# Patient Record
Sex: Female | Born: 1970 | Race: White | Hispanic: No | Smoking: Current every day smoker
Health system: Southern US, Community
[De-identification: ages and names within clinical notes are randomized; demographics above are authoritative.]

## PROBLEM LIST (undated history)

## (undated) DIAGNOSIS — M25512 Pain in left shoulder: Secondary | ICD-10-CM

## (undated) DIAGNOSIS — M549 Dorsalgia, unspecified: Secondary | ICD-10-CM

## (undated) DIAGNOSIS — G8929 Other chronic pain: Secondary | ICD-10-CM

## (undated) DIAGNOSIS — M25571 Pain in right ankle and joints of right foot: Secondary | ICD-10-CM

## (undated) DIAGNOSIS — M5430 Sciatica, unspecified side: Secondary | ICD-10-CM

## (undated) HISTORY — PX: TUBAL LIGATION: SHX77

---

## 2014-09-08 ENCOUNTER — Emergency Department (HOSPITAL_COMMUNITY)
Admission: EM | Admit: 2014-09-08 | Discharge: 2014-09-08 | Disposition: A | Discharge status: Home / Self Care | Payer: Self-pay | Attending: Emergency Medicine | Admitting: Emergency Medicine

## 2014-09-08 ENCOUNTER — Emergency Department (HOSPITAL_COMMUNITY): Payer: Self-pay

## 2014-09-08 ENCOUNTER — Encounter (HOSPITAL_COMMUNITY): Payer: Self-pay

## 2014-09-08 DIAGNOSIS — G8929 Other chronic pain: Secondary | ICD-10-CM | POA: Insufficient documentation

## 2014-09-08 DIAGNOSIS — M25512 Pain in left shoulder: Secondary | ICD-10-CM | POA: Insufficient documentation

## 2014-09-08 DIAGNOSIS — M25571 Pain in right ankle and joints of right foot: Secondary | ICD-10-CM | POA: Insufficient documentation

## 2014-09-08 DIAGNOSIS — Z720 Tobacco use: Secondary | ICD-10-CM | POA: Insufficient documentation

## 2014-09-08 DIAGNOSIS — Z880 Allergy status to penicillin: Secondary | ICD-10-CM | POA: Insufficient documentation

## 2014-09-08 HISTORY — DX: Sciatica, unspecified side: M54.30

## 2014-09-08 HISTORY — DX: Dorsalgia, unspecified: M54.9

## 2014-09-08 HISTORY — DX: Other chronic pain: G89.29

## 2014-09-08 HISTORY — DX: Pain in left shoulder: M25.512

## 2014-09-08 HISTORY — DX: Pain in right ankle and joints of right foot: M25.571

## 2014-09-08 MED ORDER — ACETAMINOPHEN 500 MG PO TABS
1000.0000 mg | ORAL_TABLET | Freq: Once | ORAL | Status: AC
  Administered 2014-09-08: 1000 mg via ORAL
  Filled 2014-09-08: qty 2

## 2014-09-08 MED ORDER — METHOCARBAMOL 500 MG PO TABS: 1000.0000 mg | ORAL_TABLET | Freq: Four times a day (QID) | ORAL | Status: AC | PRN

## 2014-09-08 NOTE — Discharge Instructions (Signed)
°Emergency Department Resource Guide °1) Find a Doctor and Pay Out of Pocket °Although you won't have to find out who is covered by your insurance plan, it is a good idea to ask around and get recommendations. You will then need to call the office and see if the doctor you have chosen will accept you as a new patient and what types of options they offer for patients who are self-pay. Some doctors offer discounts or will set up payment plans for their patients who do not have insurance, but you will need to ask so you aren't surprised when you get to your appointment. ° °2) Contact Your Local Health Department °Not all health departments have doctors that can see patients for sick visits, but many do, so it is worth a call to see if yours does. If you don't know where your local health department is, you can check in your phone book. The CDC also has a tool to help you locate your state's health department, and many state websites also have listings of all of their local health departments. ° °3) Find a Walk-in Clinic °If your illness is not likely to be very severe or complicated, you may want to try a walk in clinic. These are popping up all over the country in pharmacies, drugstores, and shopping centers. They're usually staffed by nurse practitioners or physician assistants that have been trained to treat common illnesses and complaints. They're usually fairly quick and inexpensive. However, if you have serious medical issues or chronic medical problems, these are probably not your best option. ° °No Primary Care Doctor: °- Call [facility] at  832-8000 - they can help you locate a primary care doctor that  accepts your insurance, provides certain services, etc. °- Physician Referral Service- 1-800-533-3463 ° °Chronic Pain Problems: °Organization         Address  Phone   Notes  °[facility]  (336) 297-2271 Patients need to be referred by their primary care doctor.  ° °Medication  Assistance: °Organization         Address  Phone   Notes  °[place] [facility] [address] °[place], [place] [address] (336) 641-8030 --Must be a resident of [place] °-- Must have NO insurance coverage whatsoever (no Medicaid/ Medicare, etc.) °-- The pt. MUST have a primary care doctor that directs their care regularly and follows them in the community °  °[facility]  (866) 331-1348   °[employer]  (888) 892-1162   ° °Agencies that provide inexpensive medical care: °Organization         Address  Phone   Notes  °[facility]  (336) 832-8035   °[facility]    (336) 832-7272   °[facility] [address] °[place], [place] [address] (336) 832-4777   °[facility] [address], °[place] (336) 271-4999   °[facility]    (336) 373-0678   °[facility]    (336) 272-1050   °[facility] ° [address], [place] Phone:  (336) 832-4444, Fax:  (336) 832-4440 Hours of Operation:  9 am - 6 pm, M-F.  Also accepts Medicaid/Medicare and self-pay.  °[facility] ° [address], [place] Phone: (336) 832-3150, Fax: (336) 832-3151. Hours of Operation:  8:30 am - 5:30 pm, M-F.  Also accepts Medicaid and self-pay.  °[facility] [place] [address], [place] Phone: (336) 878-6027   °[facility] [address], [place], [place] (336)723-1848, Ext. 123 Mondays & Thursdays: 7-9 AM.  First 15 patients are seen on a first come, first serve basis. °  ° °Medicaid-accepting [place] Providers: ° °Organization         Address  Phone   Notes  °[facility] [address], [place] (336) 641-2100 Also accepts self-pay patients.  °[facility] [address], [place] ° (336) 856-9996   °[facility] [address], [place]  (336) 288-8857   °[facility] [address] [place] [address], [place] (336) 299-7000   °Veita Bland [address], [place]  ° (336) 373-1557 Only accepts [place] Access Medicaid patients after they have their name applied to their card.  ° °Self-Pay (no insurance) in [place]: ° °Organization         Address  Phone   Notes  °Sickle Cell Patients, [facility] [address], [place] (336) 832-1970   °[facility] [address], [place] (336) 832-4400   °[facility] ° [address], [place] (336) 992-4800   °[facility]/Dr. Osei-Bonsu ° [address] [place] [address], [place] or [address], [place] (336) 841-8500 Phone number for both [place] and [place] locations is the same.  °[facility] [address], [place] (336) 299-0000   °[facility] [address] [place] [address], [place] or [address] (336) 852-7530 °(336) 878-2260   °[facility] [address], [place] (336) 350-1642, phone; (336) 294-5005, fax Sees patients 1st and 3rd Saturday of every month.  Must not qualify for public or private insurance (i.e. Medicaid, Medicare, [place] Health Choice, Veterans' Benefits) • Household income should be no more than 200% of the poverty level •The clinic cannot treat you if you are pregnant or think you are pregnant • Sexually transmitted diseases are not treated at the clinic.  ° ° °Dental Care: °Organization         Address  Phone  Notes  °[place] [facility] [address], [place] (336) 641-6152 Accepts children up to age 21 who are enrolled in Medicaid or [place] Health Choice; pregnant women with a Medicaid card; and children who have applied for Medicaid or [place] Health Choice, but were declined, whose parents can pay a reduced fee at time of service.  °[place]  [facility] [place]  [address], [place] (336) 641-7733 Accepts children up to age 21 who are enrolled in Medicaid or [place] Health Choice; pregnant women with a Medicaid card; and children who have applied for Medicaid or [place] Health Choice, but were declined, whose parents can pay a reduced fee at time of service.  °[facility] ° [address], [place] (336) 641-4533 Patients are seen by appointment only. Walk-ins are not accepted. [facility] will see patients 18 years of age and older. °Monday - Tuesday (8am-5pm) °Most Wednesdays (8:30-5pm) °$30 per visit, cash only  °[facility] ° [address], [place] (336) 641-4533 Patients are seen by appointment only. Walk-ins are not accepted. [facility] will see patients 18 years of age and older. °One   Wednesday Evening (Monthly: Volunteer Based).  $30 per visit, cash only  °[facility]  (919) 537-3737 for adults; Children under age 4, call Graduate Pediatric Dentistry at ([phone]) [phone]. Children aged 4-14, please call ([phone]) [phone] to request a pediatric application. ° Dental services are provided in all areas of dental care including fillings, crowns and bridges, complete and partial dentures, implants, gum treatment, root canals, and extractions. Preventive care is also provided. Treatment is provided to both adults and children. °Patients are selected via a lottery and there is often a waiting list. °  °[facility] [address], °[place] ° (336) 763-8833 [email or web address] °  °[facility] [address], [place], [place] (336)723-1848, Ext. 123 Second and Fourth Thursday of each month, opens at 6:30 AM; Clinic ends at 9 AM.  Patients are seen on a first-come first-served basis, and a limited number are seen during each clinic.  ° °[facility] ° [address], [place], [place] (336) [phone]    Eligibility Requirements °You must have lived in [place], [place], or [place] counties for at least the last three months. °  You cannot be eligible for state or federal sponsored healthcare insurance, including Veterans Administration, Medicaid, or Medicare. °  You generally cannot be eligible for healthcare insurance through your employer.  °  How to apply: °Eligibility screenings are held every Tuesday and Wednesday afternoon from 1:00 pm until 4:00 pm. You do not need an appointment for the interview!  °[facility] [address], [place], [place] 336-631-2330   °[facility]  336-342-8273   °[facility]  336-703-3100   °[facility]  336-570-6415   ° °Behavioral Health Resources in the Community: °Intensive Outpatient Programs °Organization         Address  Phone  Notes  °[facility] [address], [place], [place] 336-878-6098   °[facility] [address], [place], [place] 336-832-9800   °[facility] [address], [place], [place] ° 336-882-2125   °[facility] [address],  °[place], [place] 1-800-853-5163 or 336-641-4981   °Substance Abuse Resources °Organization         Address  Phone  Notes  °[facility]  336-882-2125   °[facility]  336-784-9470   °[facility]  336-285-9073   °[facility]  336-845-3988   °Residential & Outpatient Substance Abuse Program  1-800-659-3381   °Psychological Services °Organization         Address  Phone  Notes  °[facility]  336- 832-9600   °[facility]  336- 378-7881   °[facility] [address], [place] 1-800-853-5163 or 336-641-4981   ° °Mobile Crisis Teams °Organization         Address  Phone  Notes  °[facility], Mobile Crisis Care Unit  1-877-626-1772   °Assertive °[facility] ° [address]  [place], [place] 336-834-9664   °Sharon DeEsch [address] °[place] 336-554-5454   ° °Self-Help/Support Groups °Organization         Address  Phone             Notes  °[facility]  - variety of support groups  336- 373-1402 Call for more information  °Narcotics Anonymous (NA), [facility] [address], °[place]  2 meetings at this location  ° °  Residential Treatment Programs Organization         Address  Phone  Notes  [facility] [address]    [place]  0-981-191-4782   [facility]  [address], [place] [address], [place], [place] 086-578-4696   [facility] [address] [place], [place] 295-284-1324 Admissions: 8am-3pm M-F  [facility] [address]    [place], [place] 401-027-2536   [facility] [address] [place], [place], [place] 644-034-7425   [facility] [address]  [place], [place] 956-387-5643   [facility] Intensive Outpatient [address], [place], [place] 329-518-8416   [facility] [address] [place]  [place], [place] 6-063-016-0109 or (516) 626-7641   [facility] ([facility] [address], [place], [place] 254-270-6237 Accepts Medicaid  [facility] [address],  [place] 6-283-151-7616 Substance Abuse/Addiction Treatment   [facility]         Address  Phone  Notes  [facility]  726-858-0385   Angie Fava, PhD [address] [place] [place], [place]   3395003241 or 787-308-1424   [facility]   [address] [place], [place] (318) 849-5323   [facility] [address] [place], [place] [phone] Insurance/Medicaid/sponsorship through [facility] [address]                                    [place], [place] (574)[phone] Therapy/tele-psych/case    [facility] [address], [place] 320-050-0840    Dr. Lolly Mustache  (614)244-5810   [facility]  [employer] [facility] Dept. 1) [address], [place] 2) [address], [place] 3)  [address], [place] [phone]  984-757-0731   [facility] Child Abuse Hotline (726)418-1074 or 361-411-4930 (After Hours)      Take the prescription as directed. Also take over the counter tylenol, as directed on packaging, as needed for discomfort. Wear the ankle brace for support for the next 1 to 2 weeks. Apply moist heat or ice to the area(s) of discomfort, for 15 minutes at a time, several times per day for the next few days.  Do not fall asleep on a heating or ice pack.  Call your regular medical doctor on Monday to schedule a follow up appointment this week.  Return to the Emergency Department immediately if worsening.

## 2014-09-08 NOTE — ED Provider Notes (Signed)
CSN: [id number]     Arrival date & time 09/08/14  1916 History   First MD Initiated Contact with Patient 09/08/14 1957     Chief Complaint  Patient presents with  . Ankle Pain  . Foot Pain  . Pain    chronic pain      HPI Pt was seen at 2020. Per pt, c/o gradual onset and persistence of constant acute flair of her chronic left shoulder and right ankle "pains" that began last night. Pt denies injury, no abd pain, no CP/SOB, no neck or back pain, no focal motor weakness, no tingling/numbness in extremities, no rash, no fevers.    Past Medical History  Diagnosis Date  . Chronic pain of right ankle   . Chronic left shoulder pain   . Chronic back pain   . Sciatica    Past Surgical History  Procedure Laterality Date  . Tubal ligation      Social History  Substance Use Topics  . Smoking status: Current Every Day Smoker -- 1.00 packs/day    Types: Cigarettes  . Smokeless tobacco: None  . Alcohol Use: No    Review of Systems ROS: Statement: All systems negative except as marked or noted in the HPI; Constitutional: Negative for fever and chills. ; ; Eyes: Negative for eye pain, redness and discharge. ; ; ENMT: Negative for ear pain, hoarseness, nasal congestion, sinus pressure and sore throat. ; ; Cardiovascular: Negative for chest pain, palpitations, diaphoresis, dyspnea and peripheral edema. ; ; Respiratory: Negative for cough, wheezing and stridor. ; ; Gastrointestinal: Negative for nausea, vomiting, diarrhea, abdominal pain, blood in stool, hematemesis, jaundice and rectal bleeding. . ; ; Genitourinary: Negative for dysuria, flank pain and hematuria. ; ; Musculoskeletal: +left shoulder pain, right ankle pain. Negative for back pain and neck pain. Negative for swelling and trauma.; ; Skin: Negative for pruritus, rash, abrasions, blisters, bruising and skin lesion.; ; Neuro: Negative for headache, lightheadedness and neck stiffness. Negative for weakness, altered level of consciousness ,  altered mental status, extremity weakness, paresthesias, involuntary movement, seizure and syncope.      Allergies  Aspirin; Benadryl; Motrin; and Penicillins  Home Medications   Prior to Admission medications   Not on File   BP 132/94 mmHg  Pulse 62  Temp(Src) 97.5 F (36.4 C) (Oral)  Resp 18  Ht  (1.6 m)  Wt 162 lb (73.483 kg)  BMI 28.70 kg/m2  SpO2 100% Physical Exam  2025: Physical examination:  Nursing notes reviewed; Vital signs and O2 SAT reviewed;  Constitutional: Well developed, Well nourished, Well hydrated, In no acute distress; Head:  Normocephalic, atraumatic; Eyes: EOMI, PERRL, No scleral icterus; ENMT: Mouth and pharynx normal, Mucous membranes moist; Neck: Supple, Full range of motion, No lymphadenopathy; Cardiovascular: Regular rate and rhythm, No murmur, rub, or gallop; Respiratory: Breath sounds clear & equal bilaterally, No rales, rhonchi, wheezes.  Speaking full sentences with ease, Normal respiratory effort/excursion; Chest: Nontender, Movement normal; Abdomen: Soft, Nontender, Nondistended, Normal bowel sounds; Genitourinary: No CVA tenderness; Spine:  No midline CS, TS, LS tenderness.;; Extremities: Pulses normal. +tender to palp right lateral maleolar area without edema, erythema, ecchymosis, abrasions, deformity, or open wounds. NMS intact right foot, strong pedal pp, LE muscle compartments soft.  No right proximal fibular head tenderness, no knee tenderness, no foot tenderness.  +plantarflexion of right foot w/calf squeeze.  No palpable gap right Achilles's tendon. No calf tenderness, edema or asymmetry. Left shoulder w/FROM. +generalized TTP palp entire shoulder. Left clavicle  NT, scapula NT, proximal humerus NT, biceps tendon NT over bicipital groove.  Motor strength at shoulder normal.  Sensation intact over deltoid region, distal NMS intact with left hand having intact and equal sensation and strength in the distribution of the median, radial, and ulnar  nerve function compared to opposite side.  Strong radial pulse. No deformity, no rash, no edema, no ecchymosis. +FROM left elbow with intact motor strength biceps and triceps muscles to resistance.; Neuro: AA&Ox3, Major CN grossly intact.  Speech clear. No gross focal motor or sensory deficits in extremities. Climbs on and off stretcher easily by herself. Gait steady.; Skin: Color normal, Warm, Dry.   ED Course  Procedures (including critical care time)  Labs Review  Imaging Review  I have personally reviewed and evaluated these images and lab results as part of my medical decision-making.   EKG Interpretation None      MDM  MDM Reviewed: previous chart, nursing note and vitals Reviewed previous: x-ray Interpretation: x-ray    Dg Ankle Complete Right 09/08/2014   CLINICAL DATA:  Right ankle pain, nontraumatic.  EXAM: RIGHT ANKLE - COMPLETE 3+ VIEW  COMPARISON:  None.  FINDINGS: There is no evidence of fracture, dislocation, or joint effusion. There is no evidence of arthropathy or other focal bone abnormality. Soft tissues are unremarkable.  IMPRESSION: Negative.   Electronically Signed   By: Ellery Plunk M.D.   On: 09/08/2014 21:40   Dg Shoulder Left 09/08/2014   CLINICAL DATA:  Chronic left shoulder pain.  Initial encounter.  EXAM: LEFT SHOULDER - 2+ VIEW  COMPARISON:  None.  FINDINGS: There is no evidence of fracture or dislocation. The left humeral head is seated within the glenoid fossa. The acromioclavicular joint is unremarkable in appearance. No significant soft tissue abnormalities are seen. The visualized portions of the left lung are clear.  IMPRESSION: No evidence of fracture or dislocation.   Electronically Signed   By: Roanna Raider M.D.   On: 09/08/2014 21:31   Dg Foot Complete Right 09/08/2014   CLINICAL DATA:  Right foot and ankle pain, nontraumatic.  EXAM: RIGHT FOOT COMPLETE - 3+ VIEW  COMPARISON:  None.  FINDINGS: Negative for fracture, dislocation or radiopaque  foreign body. No arthritic changes are evident. There is no bone lesion or bony destruction. No significant soft tissue abnormality is evident.  IMPRESSION: Negative.   Electronically Signed   By: Ellery Plunk M.D.   On: 09/08/2014 21:24      2030:  Pt came to the ED with 2 others "just in from [place]," also with multiple somatic complaints. EPIC chart reviewed. These 3 pt's have been evaluated at several hospitals in [place] for these same complaints. Pt queried regarding having been evaluated for her complaints previously. Pt told me no several times. I asked her again, and she still replied no. I then asked her about her ED visit on 09/01/14 at [facility] for this complaint (s/p "twisted right ankle while walking in the grass on 08/31/14"). Pt stated "well that was there in [place]." Pt informed she I would treat her pain, but not with narcotic medicines. Pt verb understanding.   2215:  XR reassuring. Tx symptomatically. No narcotics due to concern regarding drug seeking behavior. Dx and testing d/w pt.  Questions answered.  Verb understanding, agreeable to d/c home with outpt f/u.          Samuel Jester, DO 09/11/14 1740

## 2014-09-08 NOTE — ED Notes (Signed)
Patient c/o pain to right hip, right foot, bilateral ankles, and left shoulder. Patient is ambulatory into triage.

## 2014-09-08 NOTE — ED Notes (Signed)
Patient ambulated to restroom, tolerated well. 

## 2016-10-14 IMAGING — DX DG FOOT COMPLETE 3+V*R*
3 series · 3 of 3 positions shown · non-contrast
Comparison: None.

CLINICAL DATA: Right foot and ankle pain, nontraumatic.

EXAM:
RIGHT FOOT COMPLETE - 3+ VIEW

[foot ap]
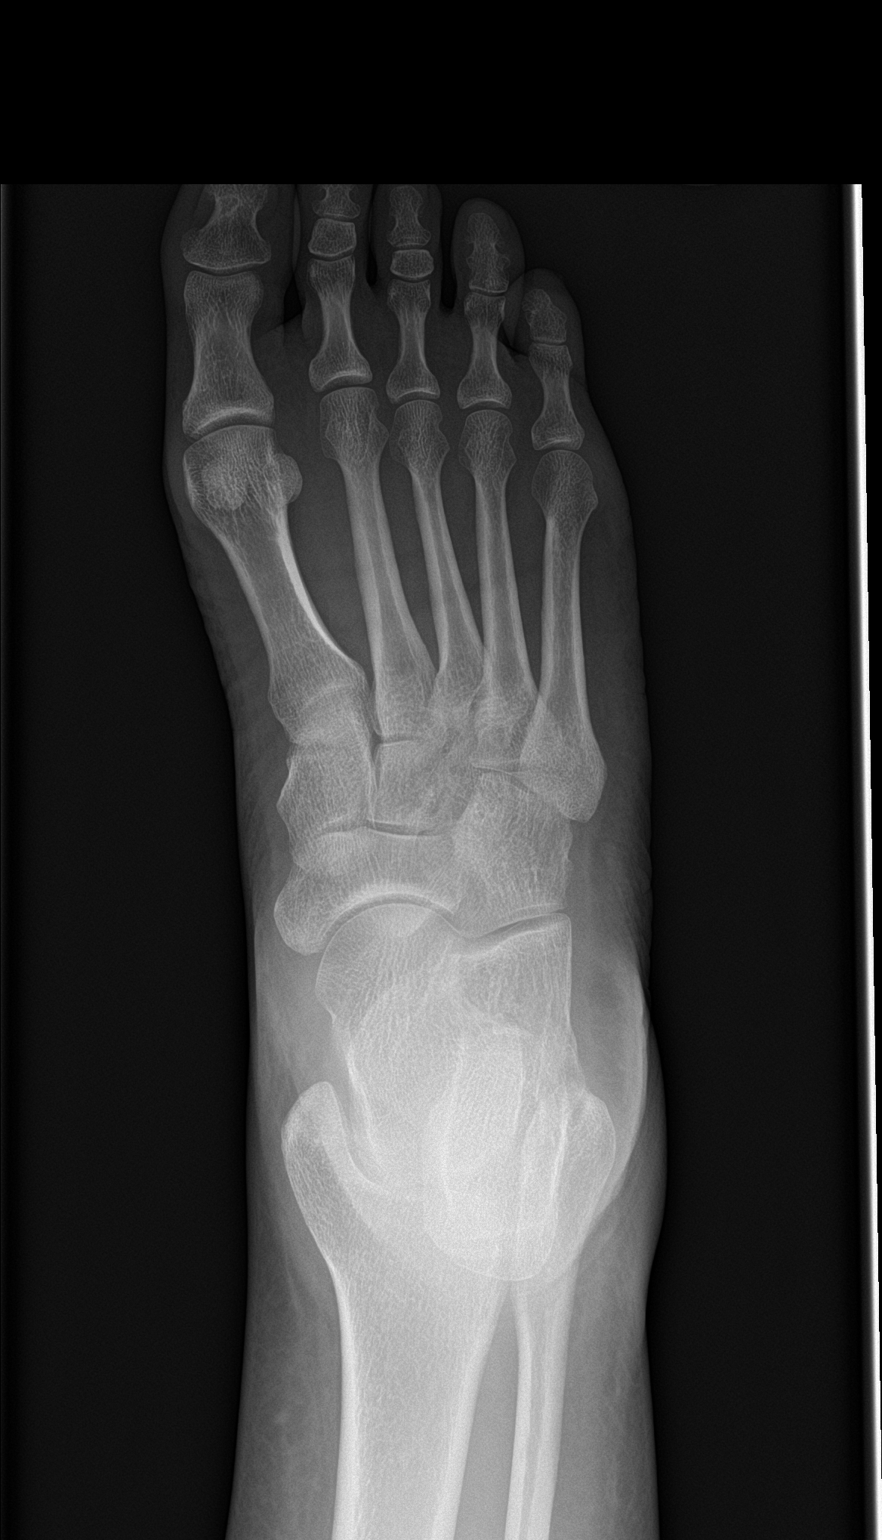

[foot obl]
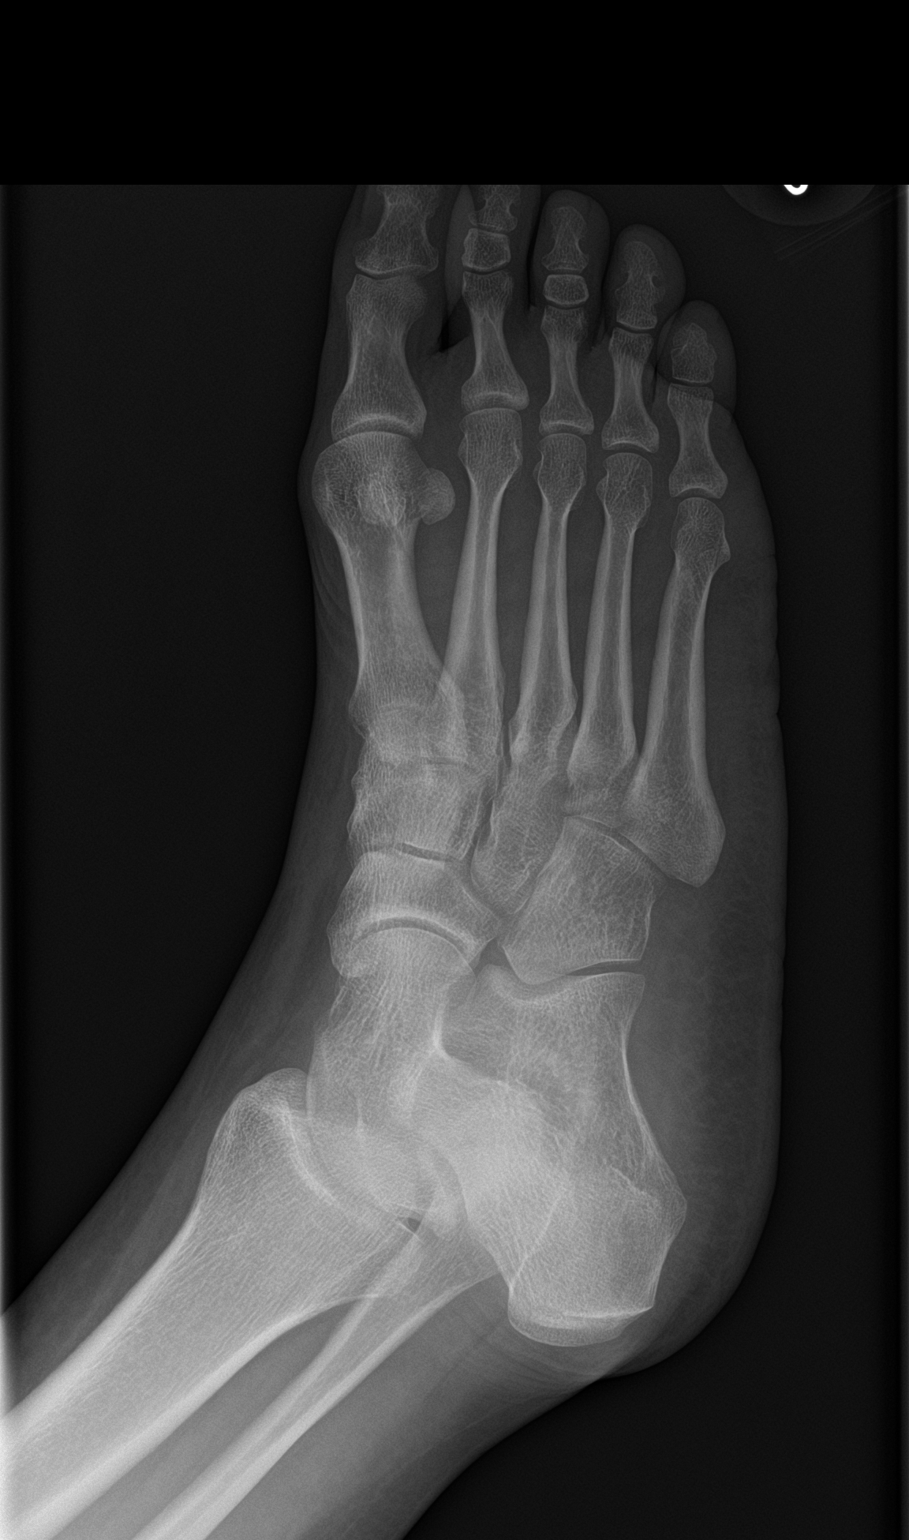

[foot lat]
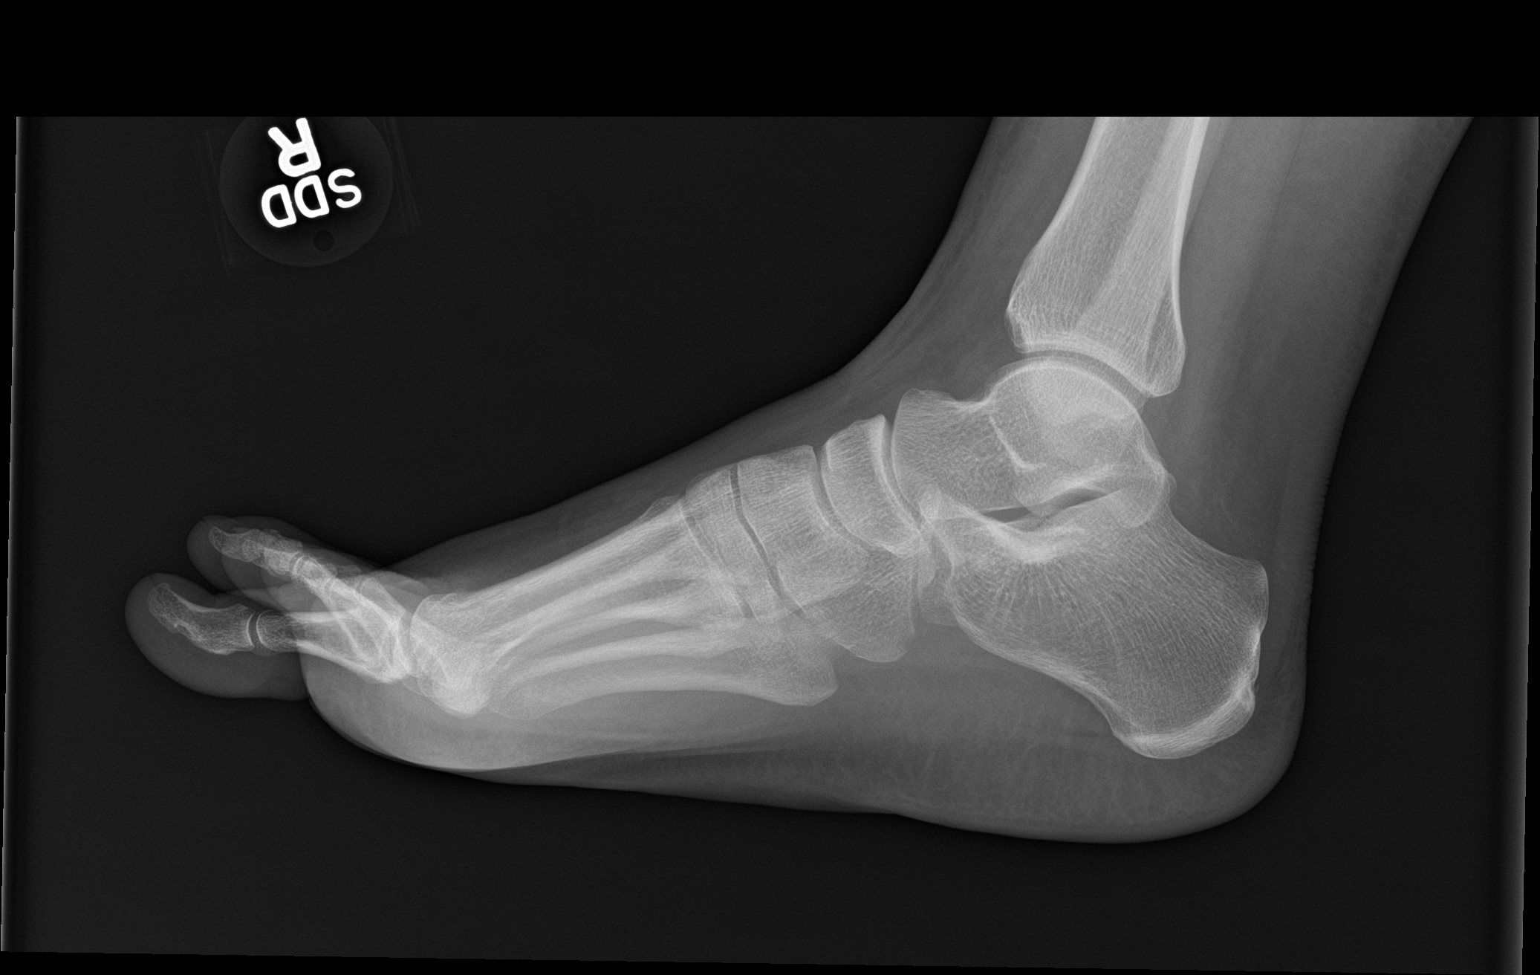

[3 of 3 positions shown; findings below may reference images not displayed]

FINDINGS: Negative for fracture, dislocation or radiopaque foreign body. No
arthritic changes are evident. There is no bone lesion or bony
destruction. No significant soft tissue abnormality is evident.
IMPRESSION: Negative.

## 2016-10-14 IMAGING — DX DG SHOULDER 2+V*L*
3 series · 3 of 3 positions shown · non-contrast
Comparison: None.

CLINICAL DATA: Chronic left shoulder pain.  Initial encounter.

EXAM:
LEFT SHOULDER - 2+ VIEW

[shoulder grashey]
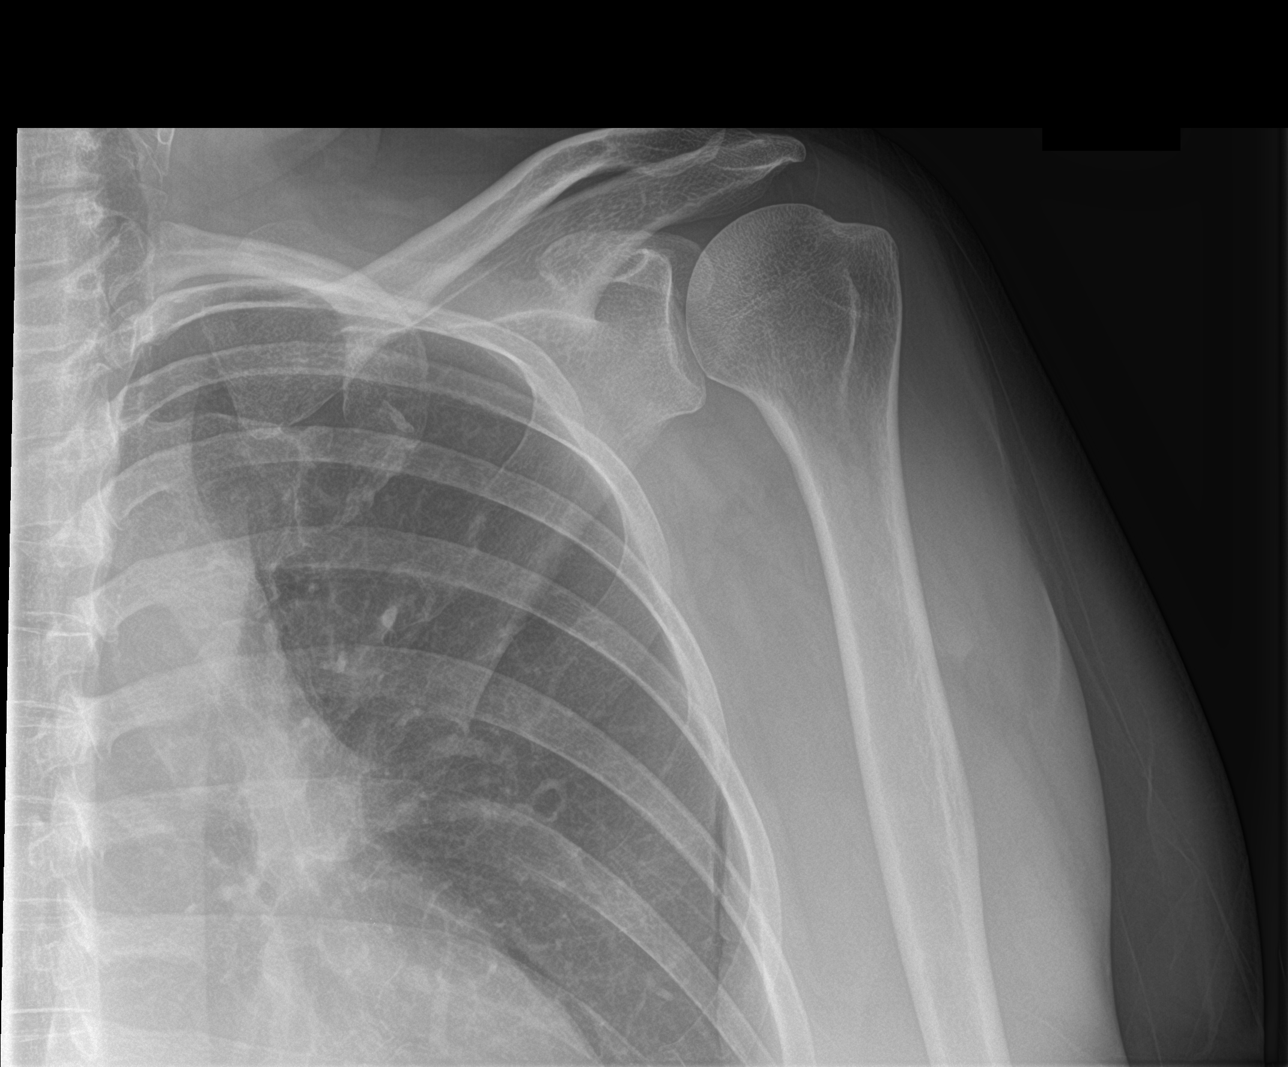

[shoulder y view]
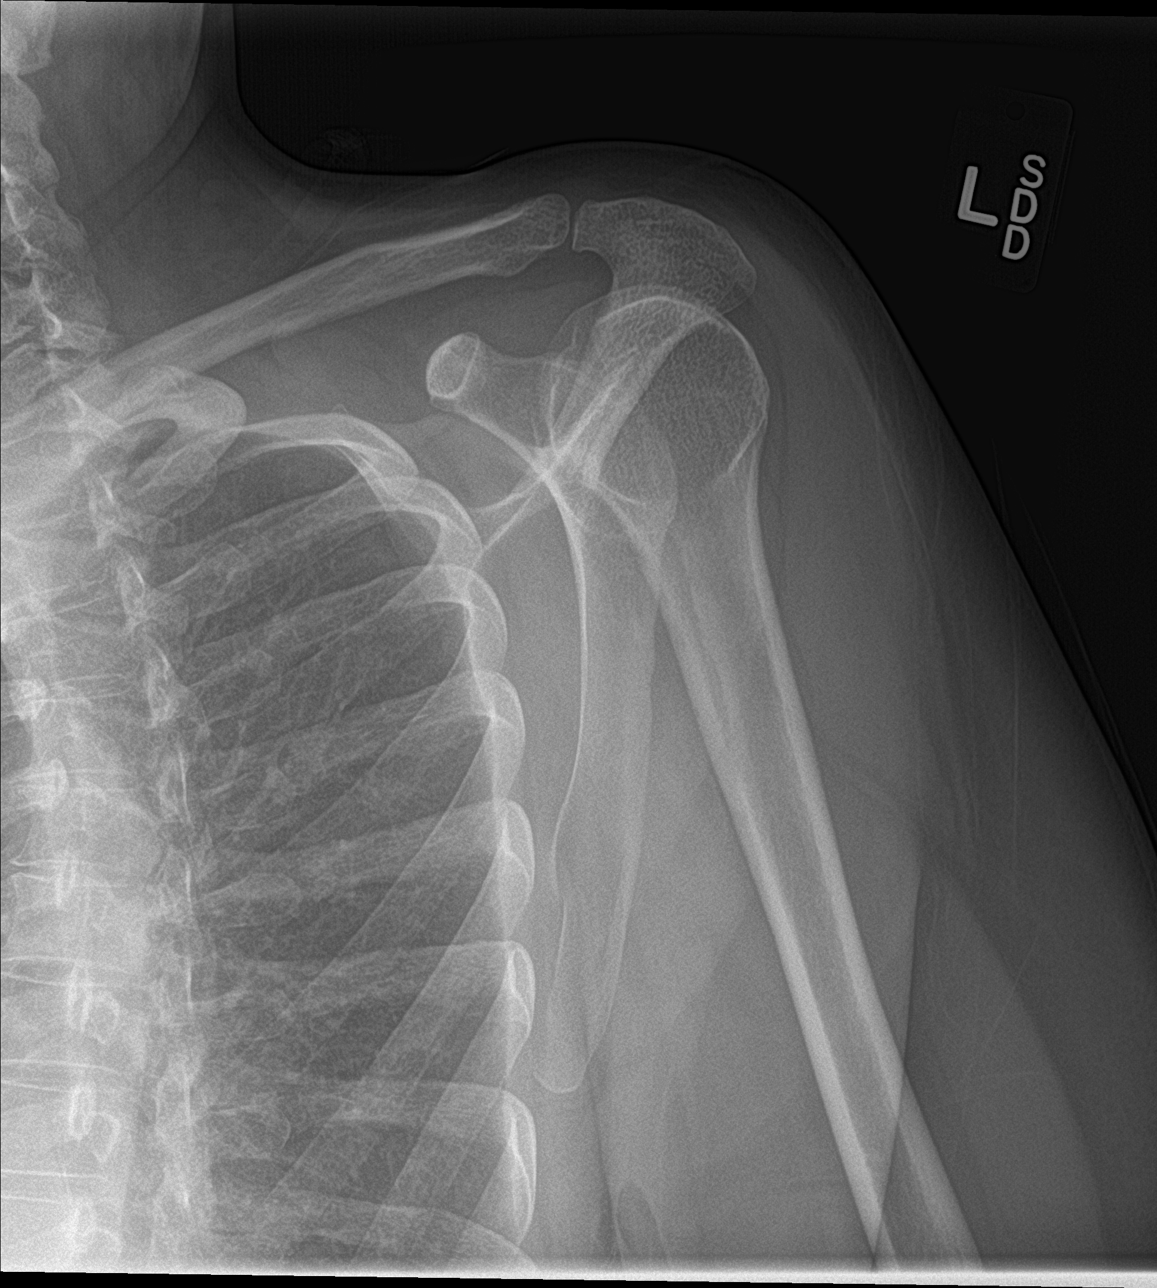

[shoulder axillary]
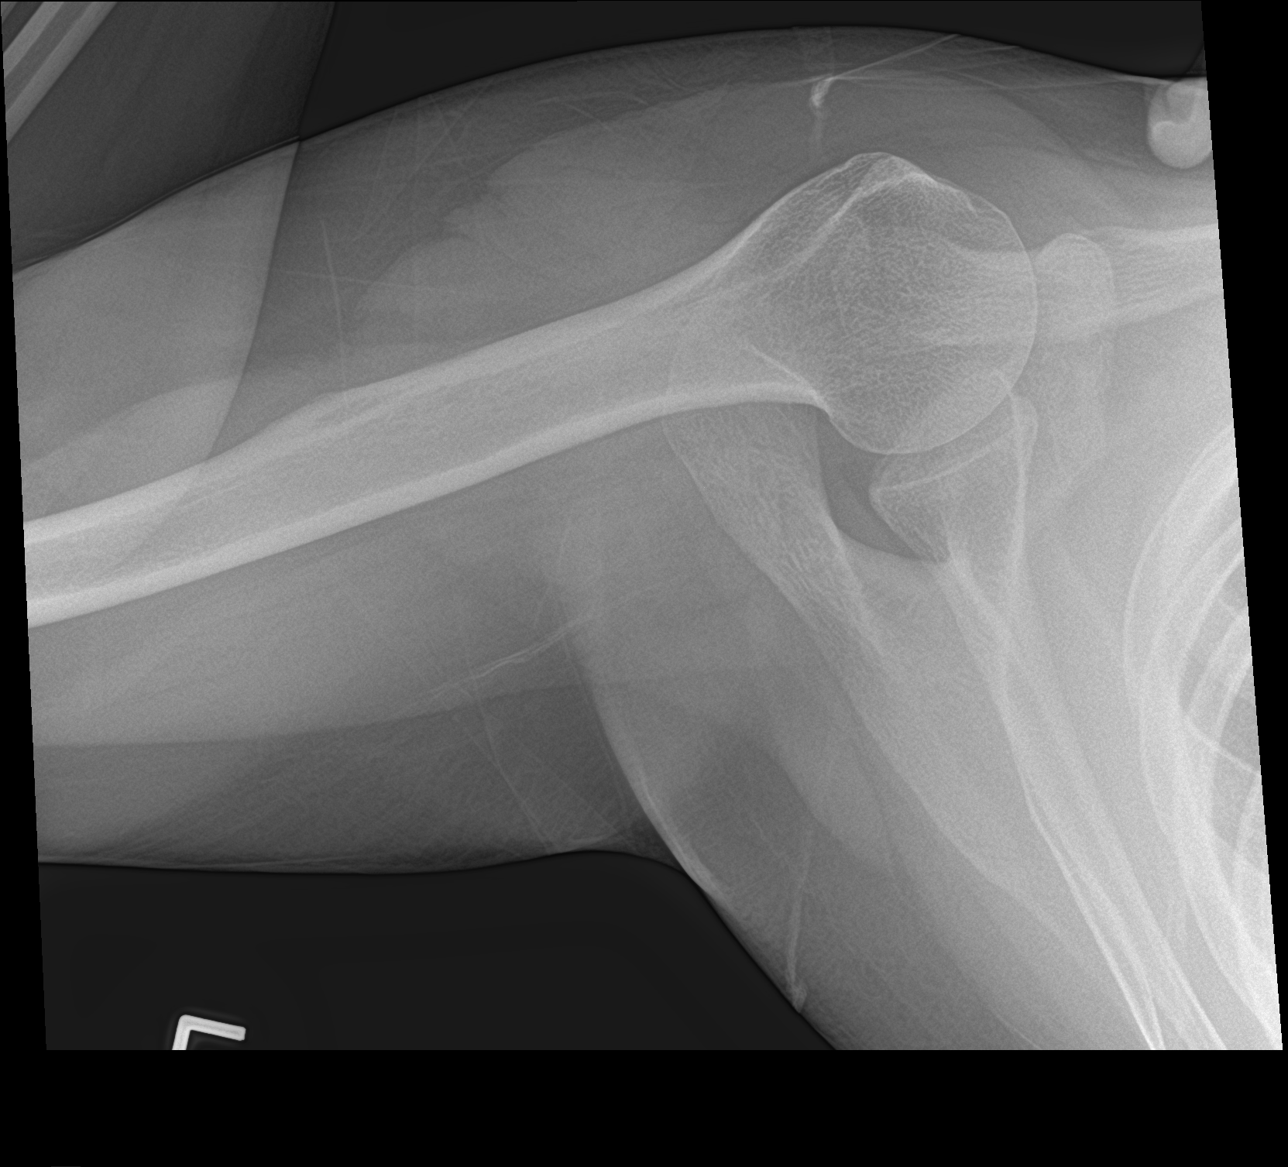

[3 of 3 positions shown; findings below may reference images not displayed]

FINDINGS: There is no evidence of fracture or dislocation. The left humeral
head is seated within the glenoid fossa. The acromioclavicular joint
is unremarkable in appearance. No significant soft tissue
abnormalities are seen. The visualized portions of the left lung are
clear.
IMPRESSION: No evidence of fracture or dislocation.

## 2018-05-06 DEATH — deceased
